# Patient Record
Sex: Male | Born: 1995 | Race: White | Hispanic: No | Marital: Single
Health system: Midwestern US, Community
[De-identification: ages and names within clinical notes are randomized; demographics above are authoritative.]

## PROBLEM LIST (undated history)

## (undated) DIAGNOSIS — F32A Depression, unspecified: Secondary | ICD-10-CM

## (undated) DIAGNOSIS — T7840XA Allergy, unspecified, initial encounter: Secondary | ICD-10-CM

## (undated) DIAGNOSIS — F329 Major depressive disorder, single episode, unspecified: Secondary | ICD-10-CM

## (undated) DIAGNOSIS — S42021A Displaced fracture of shaft of right clavicle, initial encounter for closed fracture: Secondary | ICD-10-CM

## (undated) DIAGNOSIS — S42021D Displaced fracture of shaft of right clavicle, subsequent encounter for fracture with routine healing: Secondary | ICD-10-CM

## (undated) HISTORY — PX: FRACTURE SURGERY: SHX138

## (undated) HISTORY — PX: JOINT REPLACEMENT: SHX530

## (undated) HISTORY — DX: Allergy, unspecified, initial encounter: T78.40XA

## (undated) HISTORY — DX: Depression, unspecified: F32.A

## (undated) HISTORY — DX: Major depressive disorder, single episode, unspecified: F32.9

---
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
## 2014-02-07 ENCOUNTER — Ambulatory Visit: Payer: BC Managed Care – PPO

## 2014-02-07 ENCOUNTER — Ambulatory Visit: Payer: BC Managed Care – PPO | Admitting: Family Medicine

## 2014-02-07 VITALS — BP 120/66 | HR 75 | Temp 97.4°F | Resp 16 | Ht 72.75 in | Wt 160.8 lb

## 2014-02-07 DIAGNOSIS — Z22322 Carrier or suspected carrier of Methicillin resistant Staphylococcus aureus: Secondary | ICD-10-CM

## 2014-02-07 DIAGNOSIS — S93401A Sprain of unspecified ligament of right ankle, initial encounter: Secondary | ICD-10-CM

## 2014-02-07 DIAGNOSIS — S93409A Sprain of unspecified ligament of unspecified ankle, initial encounter: Secondary | ICD-10-CM

## 2014-02-07 NOTE — Progress Notes (Signed)
Is a 18 year old athletic individual who twisted his right ankle 2 weeks ago and this had persistent swelling and tenderness anterior to the lateral malleolus since.  He's also here because he's had recent MRSA infection of the left knee he wants to make sure that his totally cleared.  Objective: Right ankle shows moderate amount of swelling and faint ecchymosis anterior to the lateral malleolus. He has full range of motion although it's stress inversion causes pain in that right ankle. There is no bony abnormality or bony tenderness.  Examination of the left knee reveals no tenderness, swelling, erythema or discharge from the area of the previous MRSA infection.  [facility] reading (PRIMARY) by  Dr. Milus GlazierLauenstein:  Right ankle-negative  Assessment: Ankle sprain with slow resolution, resolved MRSA infection  Plan: ASO/Lace up splint for the next 2 weeks.  Signed, Elvina SidleKurt Lealer [place]  No further treatment for the past MRSA contact

## 2014-04-04 ENCOUNTER — Emergency Department (HOSPITAL_BASED_OUTPATIENT_CLINIC_OR_DEPARTMENT_OTHER)
Admission: EM | Admit: 2014-04-04 | Discharge: 2014-04-04 | Disposition: A | Discharge status: Home / Self Care | Payer: BC Managed Care – PPO | Attending: Emergency Medicine | Admitting: Emergency Medicine

## 2014-04-04 ENCOUNTER — Encounter (HOSPITAL_BASED_OUTPATIENT_CLINIC_OR_DEPARTMENT_OTHER): Payer: Self-pay | Admitting: Emergency Medicine

## 2014-04-04 DIAGNOSIS — Y929 Unspecified place or not applicable: Secondary | ICD-10-CM | POA: Insufficient documentation

## 2014-04-04 DIAGNOSIS — F3289 Other specified depressive episodes: Secondary | ICD-10-CM | POA: Insufficient documentation

## 2014-04-04 DIAGNOSIS — F329 Major depressive disorder, single episode, unspecified: Secondary | ICD-10-CM | POA: Insufficient documentation

## 2014-04-04 DIAGNOSIS — Y9389 Activity, other specified: Secondary | ICD-10-CM | POA: Insufficient documentation

## 2014-04-04 DIAGNOSIS — S61209A Unspecified open wound of unspecified finger without damage to nail, initial encounter: Secondary | ICD-10-CM | POA: Insufficient documentation

## 2014-04-04 DIAGNOSIS — Z79899 Other long term (current) drug therapy: Secondary | ICD-10-CM | POA: Insufficient documentation

## 2014-04-04 DIAGNOSIS — W292XXA Contact with other powered household machinery, initial encounter: Secondary | ICD-10-CM | POA: Insufficient documentation

## 2014-04-04 DIAGNOSIS — Z792 Long term (current) use of antibiotics: Secondary | ICD-10-CM | POA: Insufficient documentation

## 2014-04-04 DIAGNOSIS — S61215A Laceration without foreign body of left ring finger without damage to nail, initial encounter: Secondary | ICD-10-CM

## 2014-04-04 MED ORDER — CEPHALEXIN 500 MG PO CAPS: 500.0000 mg | ORAL_CAPSULE | Freq: Three times a day (TID) | ORAL | Status: AC

## 2014-04-04 NOTE — ED Notes (Signed)
Laceration to the tip of his left 4th digit with a knife.

## 2014-04-04 NOTE — Discharge Instructions (Signed)
Have stitches removed in proximately one week. Keep wound clean and dry no swimming. Return for signs of infection.  If you were given medicines take as directed.  If you are on coumadin or contraceptives realize their levels and effectiveness is altered by many different medicines.  If you have any reaction (rash, tongues swelling, other) to the medicines stop taking and see a physician.   Please follow up as directed and return to the ER or see a physician for new or worsening symptoms.  Thank you. Filed Vitals:   04/04/14 1052  BP: 129/72  Pulse: 74  Temp: 98 F (36.7 C)  TempSrc: Oral  Height: 6\' 1"  (1.854 m)  Weight: 162 lb (73.483 kg)  SpO2: 100%

## 2014-04-04 NOTE — ED Provider Notes (Signed)
CSN: [id number]     Arrival date & time 04/04/14  1045 History   First MD Initiated Contact with Patient 04/04/14 1106     Chief Complaint  Patient presents with  . Laceration     (Consider location/radiation/quality/duration/timing/severity/associated sxs/prior Treatment) HPI Comments: 18 year old male with no significant medical history presents with left finger laceration since prior to arrival when playing with an exacto knife.  No other injuries, tetanus up-to-date, mild bleeding controlled with pressure.  Patient is a 18 y.o. male presenting with skin laceration. The history is provided by the patient.  Laceration   Past Medical History  Diagnosis Date  . Allergy   . Depression    Past Surgical History  Procedure Laterality Date  . Joint replacement    . Fracture surgery     Family History  Problem Relation Age of Onset  . Hypertension Father   . Cancer Paternal Grandmother   . Heart attack Paternal Grandfather    History  Substance Use Topics  . Smoking status: Never Smoker   . Smokeless tobacco: Not on file  . Alcohol Use: Yes     Comment: 8 DRINKS WEEKLY     Review of Systems  Skin: Positive for wound.  Neurological: Negative for weakness and numbness.      Allergies  Other and Peanuts  Home Medications   Prior to Admission medications   Medication Sig Start Date End Date Taking? Authorizing Provider  buPROPion (WELLBUTRIN XL) 300 MG 24 hr tablet Take 300 mg by mouth daily.    Historical Provider, MD  cephALEXin (KEFLEX) 500 MG capsule Take 1 capsule (500 mg total) by mouth 3 (three) times daily. 04/04/14   Enid Skeens, MD   BP 129/72  Pulse 74  Temp(Src) 98 F (36.7 C) (Oral)  Ht 6\' 1"  (1.854 m)  Wt 162 lb (73.483 kg)  BMI 21.38 kg/m2  SpO2 100% Physical Exam  Nursing note and vitals reviewed. Constitutional: He appears well-developed and well-nourished. No distress.  HENT:  Head: Atraumatic.  Cardiovascular: Normal rate.    Pulmonary/Chest: Effort normal.  Musculoskeletal: He exhibits tenderness.  Neurological: He is alert.  Skin:  Patient has mild tenderness and 1.5 cm curved laceration to the tip of his ring finger on the left side, no bone visualized, normal strength with flexion extension at PIP, mild bleeding mild gaping.    ED Course  Procedures (including critical care time)  LACERATION REPAIR Performed by: Enid Skeens Authorized by: Enid Skeens Consent: Verbal consent obtained. Risks and benefits: risks, benefits and alternatives were discussed Consent given by: patient Patient identity confirmed: provided demographic data Prepped and Draped in normal sterile fashion Wound explored  Laceration Location: left ring finger Laceration Length: 1.5cm No Foreign Bodies seen or palpated Anesthesia: local infiltration Local anesthetic: lidocaine 1% Anesthetic total: 3 ml Amount of cleaning: standard  Skin closure: approximated Number of sutures: 4  Technique: interupted ethilon  Patient tolerance: Patient tolerated the procedure well with no immediate complications.   Labs Review Labs Reviewed - No data to display  Imaging Review No results found.   EKG Interpretation None      MDM   Final diagnoses:  Laceration of left ring finger w/o foreign body w/o damage to nail, initial encounter   Laceration repaired in ER. Discussed return in followup, oral Keflex for 5 days. Wound cleaned Results and differential diagnosis were discussed with the patient/parent/guardian. Close follow up outpatient was discussed, comfortable with the plan.  Medications - No data to display  Filed Vitals:   04/04/14 1052  BP: 129/72  Pulse: 74  Temp: 98 F (36.7 C)  TempSrc: Oral  Height: 6\' 1"  (1.854 m)  Weight: 162 lb (73.483 kg)  SpO2: 100%        Enid SkeensJoshua M Abbigal Radich, MD 04/04/14 1159

## 2014-12-01 IMAGING — CR DG ANKLE COMPLETE 3+V*R*
2 series · 2 of 2 positions shown · non-contrast
Comparison: May 19, 2009

CLINICAL DATA: Pain post trauma

EXAM:
RIGHT ANKLE - COMPLETE 3+ VIEW

[AP]
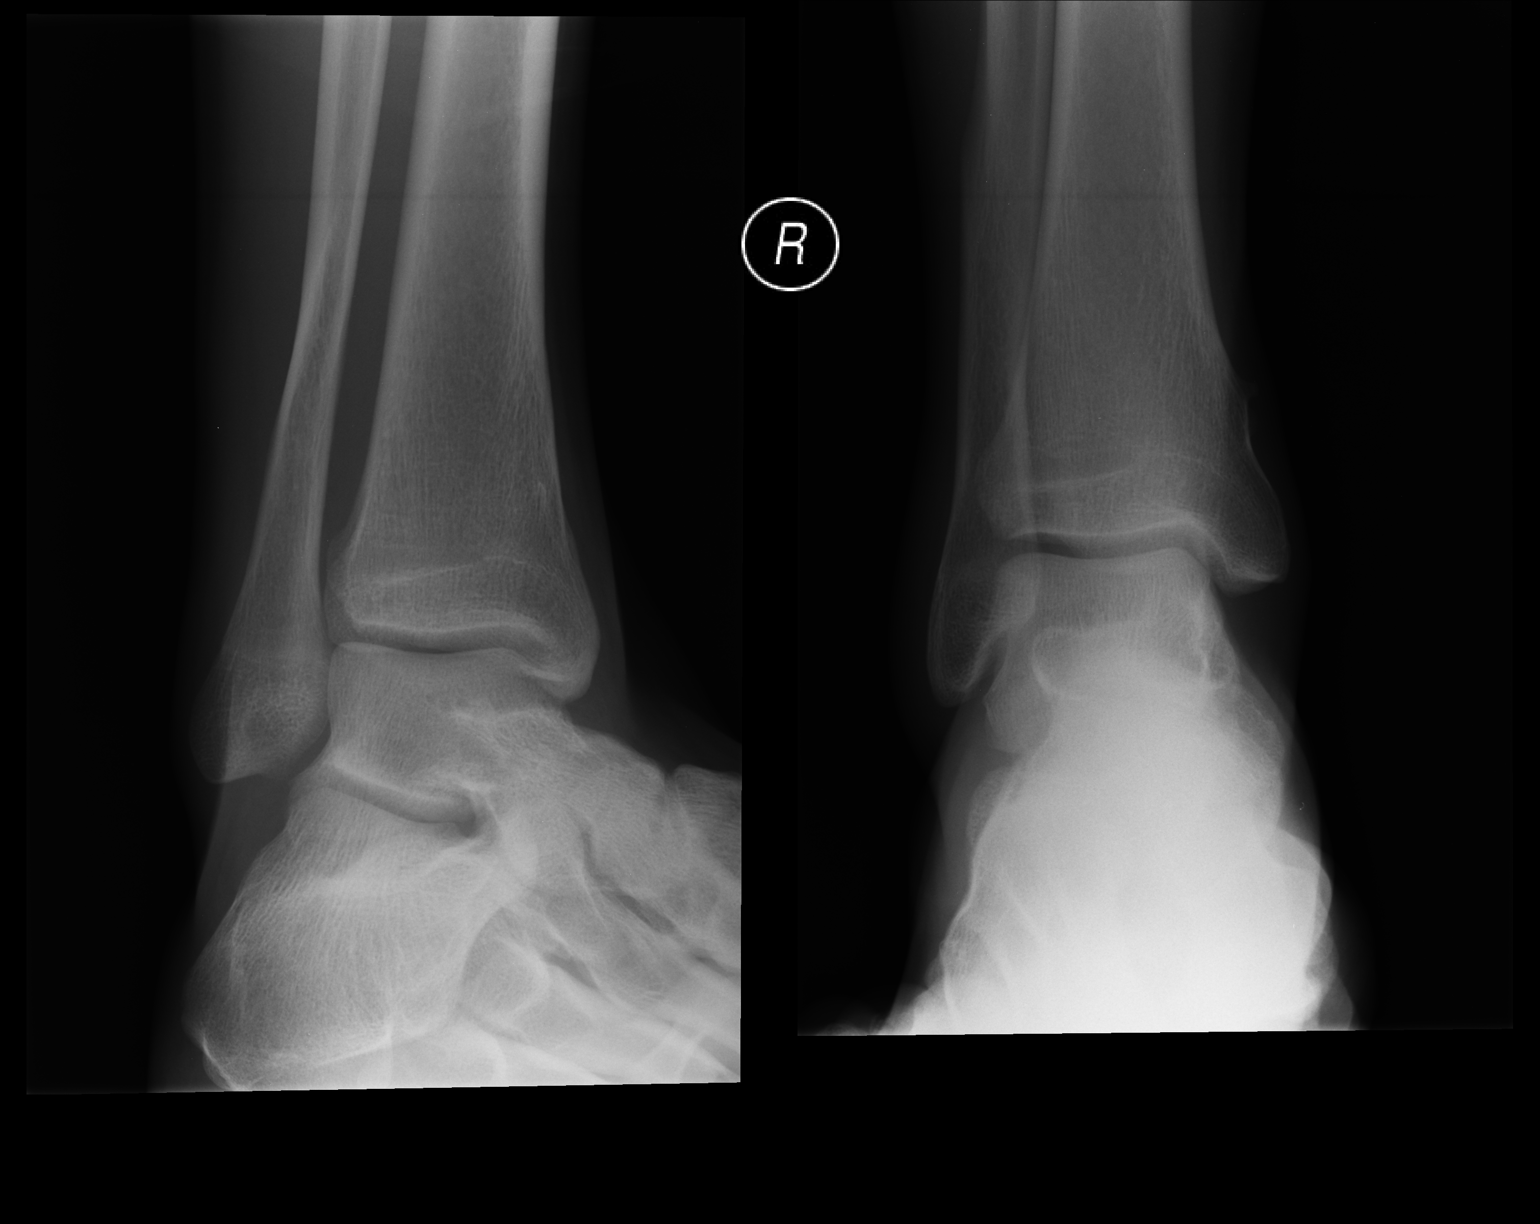

[ap obl int rot]
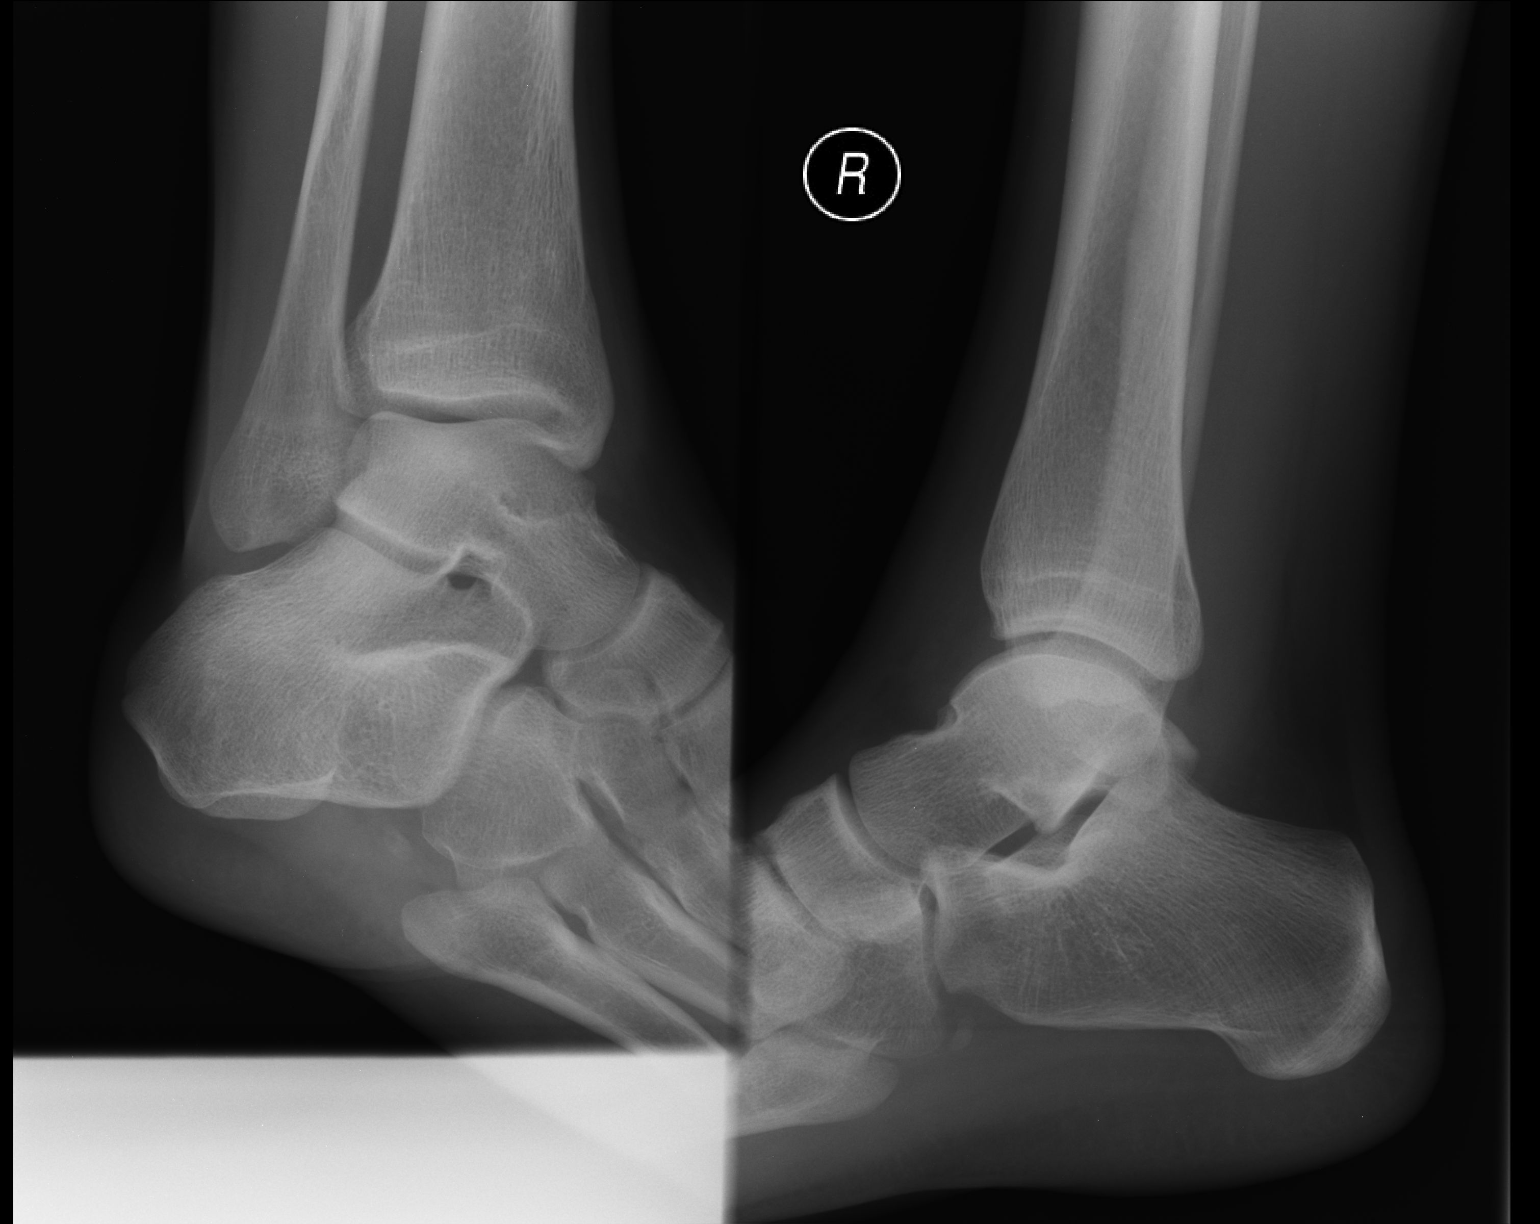

[2 of 2 positions shown; findings below may reference images not displayed]

FINDINGS: Frontal, oblique, and lateral views were obtained. There is no
fracture or effusion. Ankle mortise appears intact. No erosive
change.
IMPRESSION: No fracture.  Mortise intact.

## 2019-04-12 ENCOUNTER — Other Ambulatory Visit: Payer: Self-pay

## 2019-04-12 DIAGNOSIS — Z20822 Contact with and (suspected) exposure to covid-19: Secondary | ICD-10-CM

## 2019-04-15 LAB — NOVEL CORONAVIRUS, NAA: SARS-CoV-2, NAA: NOT DETECTED

## 2019-06-25 ENCOUNTER — Other Ambulatory Visit: Payer: Self-pay

## 2019-06-25 DIAGNOSIS — Z20822 Contact with and (suspected) exposure to covid-19: Secondary | ICD-10-CM

## 2019-06-27 LAB — NOVEL CORONAVIRUS, NAA: SARS-CoV-2, NAA: NOT DETECTED

## 2019-12-20 ENCOUNTER — Ambulatory Visit: Payer: BC Managed Care – PPO

## 2021-08-27 DIAGNOSIS — Z202 Contact with and (suspected) exposure to infections with a predominantly sexual mode of transmission: Secondary | ICD-10-CM

## 2021-08-27 NOTE — ED Provider Notes (Signed)
[facility] EMERGENCY DEPT  EMERGENCY DEPARTMENT ENCOUNTER      Pt Name: Adam Pham  MRN: [id number]  Birthdate Jan 13, 1996  Date of evaluation: 08/27/2021  Provider: Carlota Raspberry, MD    CHIEF COMPLAINT       Chief Complaint   Patient presents with    Exposure to STD     States he thinks he had oral sexual interaction with someone with an STD. Unknown what it was. Denies any symptoms at this time. States the interaction was about 1hr ago.          HISTORY OF PRESENT ILLNESS    HPI    Healthy 25 year old male presents with concerns about possible STD exposure.  The patient states he was giving oral sex to another male.  No reported symptoms from his partner but the patient frankly does not trust this sexual partner.  He is generally heterosexual.  Patient denies symptoms for himself.  No other complaints.    Nursing Notes were reviewed.    REVIEW OF SYSTEMS     Review of Systems    Except as noted above the remainder of the review of systems was reviewed and negative.       PAST MEDICAL HISTORY   History reviewed. No pertinent past medical history.    SURGICAL HISTORY     History reviewed. No pertinent surgical history.    CURRENT MEDICATIONS       Previous Medications    No medications on file       ALLERGIES     Patient has no known allergies.    FAMILY HISTORY     No family history on file.     SOCIAL HISTORY       Social History     Socioeconomic History    Marital status: Single     Spouse name: None    Number of children: None    Years of education: None    Highest education level: None       SCREENINGS       PHYSICAL EXAM       ED Triage Vitals [08/28/21 0030]   BP Temp Temp src Heart Rate Resp SpO2 Height Weight   114/70 98.4 ??F (36.9 ??C) -- 66 16 98 % 6' (1.829 m) 170 lb (77.1 kg)       Gen:  Alert, no acute distress or signs of toxicity  VS: Within normal limits and noted  HEENT: Pupils equal and reactive, o/p clear without erythema or exudates, mucous membranes moist  Neck: No nuchal rigidity or  adenopathy.  Cardiovascular: Regular rate and rhythm, no audible murmur  Lungs: No respiratory distress, O2 sat 100% on room air which is normal  Abdomen: Soft, nondistended  Musculoskeletal:  No tenderness or deformity. No range of motion deficit.   Neurologic: Alert, normal gait, no focal motor or sensory deficits  Skin: Warm and dry, normal turgor    DIAGNOSTIC RESULTS   Procedures       EKG: All EKG's are interpreted by the Emergency Department Physician who either signs or Co-signs this chart in the absence of a cardiologist.    RADIOLOGY    Non-plain film images such as CT, Ultrasound and MRI are read by the radiologist. Plain radiographic images are visualized and preliminarily interpreted by the emergency physician with the below findings:    Interpretation per the Radiologist below, if available at the time of this note:    No orders to display  LABS:  Labs Reviewed - No data to display    All other labs were within normal range or not returned as of this dictation.    EMERGENCY DEPARTMENT COURSE/REASSESSMENT and MDM:   MDM      ED Course as of 08/28/21 0050   Fri Aug 28, 2021   7096 Treated empirically for gonorrhea and chlamydia.  I had a brief discussion about viral allergies and how to monitor symptoms.  He was concerned about HIV and I explained that screening now would do no good but in 6 to 8 weeks would recommend a visit either to the health department or he should donate blood and then he would be able to get screened for HIV at that time.  Patient understands and is comfortable with this plan. [RE]      ED Course User Index  [RE] Carlota Raspberry, MD       PROCEDURES            CONSULTS:  None    FINAL IMPRESSION      1. Possible exposure to STD          DISPOSITION/PLAN   DISPOSITION Decision To Discharge 08/28/2021 12:47:50 AM      PATIENT REFERRED TO:  [facility]  [address]  [place] [address]  In 1 week  As needed    DISCHARGE  MEDICATIONS:  New Prescriptions    No medications on file     Controlled Substances Monitoring:     No flowsheet data found.    (Please note that portions of this note were completed with a voice recognition program.  Efforts were made to edit the dictations but occasionally words are mis-transcribed.)    Carlota Raspberry, MD (electronically signed)  Attending Emergency Physician            Carlota Raspberry, MD  08/28/21 [phone]

## 2021-08-28 ENCOUNTER — Inpatient Hospital Stay
Admit: 2021-08-28 | Discharge: 2021-08-28 | Disposition: A | Discharge status: Home / Self Care | Payer: PRIVATE HEALTH INSURANCE | Attending: Emergency Medicine

## 2021-08-28 DIAGNOSIS — Z202 Contact with and (suspected) exposure to infections with a predominantly sexual mode of transmission: Secondary | ICD-10-CM

## 2021-08-28 MED ORDER — AZITHROMYCIN 250 MG PO TABS
250 MG | Freq: Once | ORAL | Status: AC
Start: 2021-08-28 — End: 2021-08-28
  Administered 2021-08-28: 06:00:00 1000 mg via ORAL

## 2021-08-28 MED ORDER — CEFTRIAXONE SODIUM 1 G IJ SOLR
1 g | Freq: Once | INTRAMUSCULAR | Status: AC
Start: 2021-08-28 — End: 2021-08-28
  Administered 2021-08-28: 06:00:00 500 mg via INTRAMUSCULAR

## 2021-08-28 MED FILL — AZITHROMYCIN 250 MG PO TABS: 250 MG | ORAL | Qty: 4

## 2021-08-28 MED FILL — CEFTRIAXONE SODIUM 1 G IJ SOLR: 1 g | INTRAMUSCULAR | Qty: 1000

## 2021-10-12 ENCOUNTER — Ambulatory Visit: Admit: 2021-10-12 | Discharge: 2021-10-12 | Payer: PRIVATE HEALTH INSURANCE

## 2021-10-12 ENCOUNTER — Ambulatory Visit: Admit: 2021-10-12 | Discharge: 2021-10-12 | Payer: PRIVATE HEALTH INSURANCE | Attending: Surgical

## 2021-10-12 MED ORDER — HYDROCODONE-ACETAMINOPHEN 7.5-325 MG PO TABS
ORAL_TABLET | Freq: Four times a day (QID) | ORAL | 0 refills | Status: DC | PRN
Start: 2021-10-12 — End: 2021-10-12

## 2021-10-12 MED ORDER — CELECOXIB 200 MG PO CAPS
200 MG | ORAL_CAPSULE | Freq: Two times a day (BID) | ORAL | 0 refills | Status: DC
Start: 2021-10-12 — End: 2021-10-12

## 2021-10-12 MED ORDER — HYDROCODONE-ACETAMINOPHEN 7.5-325 MG PO TABS
ORAL_TABLET | Freq: Four times a day (QID) | ORAL | 0 refills | Status: DC | PRN
Start: 2021-10-12 — End: 2021-10-16

## 2021-10-12 MED ORDER — CYCLOBENZAPRINE HCL 10 MG PO TABS
10 MG | ORAL_TABLET | Freq: Three times a day (TID) | ORAL | 0 refills | Status: AC | PRN
Start: 2021-10-12 — End: 2021-10-22

## 2021-10-12 MED ORDER — CELECOXIB 200 MG PO CAPS
200 MG | ORAL_CAPSULE | Freq: Two times a day (BID) | ORAL | 0 refills | Status: AC
Start: 2021-10-12 — End: ?

## 2021-10-12 MED ORDER — CYCLOBENZAPRINE HCL 10 MG PO TABS
10 MG | ORAL_TABLET | Freq: Three times a day (TID) | ORAL | 0 refills | Status: DC | PRN
Start: 2021-10-12 — End: 2021-10-12

## 2021-10-12 NOTE — Progress Notes (Signed)
ORTHOPAEDIC SURGERY CLINIC NOTE    No chief complaint on file.      History of Present Illness:  Patient is a 26 year old male presents today with chief complaint of pain localized to the right clavicle.  Symptoms began acutely this past Saturday when he was snowboarding out in [place] and on his first run he sustained a fall and injured the aforementioned area.  He had immediate pain and limited motion of the right shoulder.  He was seen at an emergency center out there and x-rays were obtained and he was placed in a sling and advised follow-up here for further care.  He describes pain at the midshaft clavicle with some radiation into the shoulder.  He denies injury to other areas.  Denies shortness of breath or chest pain.  Denies loss of sensation or paresthesias.  Denies any specific injury to the glenohumeral joint.  Request evaluation and treatment.  He states he is very active and athletic.  Denies complaints referable to shortness of breath or chest pain or DVT or infection.    Review of Systems:  See HPI for pertinent Review of System Information.  No Known Allergies   Current Outpatient Medications   Medication Sig Dispense Refill    cyclobenzaprine (FLEXERIL) 10 MG tablet Take 1 tablet by mouth 3 times daily as needed for Muscle spasms 21 tablet 0    HYDROcodone-acetaminophen (NORCO) 7.5-325 MG per tablet Take 1 tablet by mouth every 6 hours as needed for Pain for up to 5 days. Take lowest dose possible to manage pain Max Daily Amount: 4 tablets 20 tablet 0    celecoxib (CELEBREX) 200 MG capsule Take 1 capsule by mouth 2 times daily 1 capsule by mouth twice a day with food 60 capsule 0     No current facility-administered medications for this visit.     No past medical history on file.   No past surgical history on file.  No family history on file.  Social History     Socioeconomic History    Marital status: Single     Spouse name: Not on file    Number of children: Not on file    Years of education:  Not on file    Highest education level: Not on file   Occupational History    Not on file   Tobacco Use    Smoking status: Not on file    Smokeless tobacco: Not on file   Substance and Sexual Activity    Alcohol use: Not on file    Drug use: Not on file    Sexual activity: Not on file   Other Topics Concern    Not on file   Social History Narrative    Not on file     Social Determinants of Health     Financial Resource Strain: Not on file   Food Insecurity: Not on file   Transportation Needs: Not on file   Physical Activity: Not on file   Stress: Not on file   Social Connections: Not on file   Intimate Partner Violence: Not on file   Housing Stability: Not on file      There were no vitals filed for this visit.  Physical Examination:  General:  well-nourished, well-developed, alert and oriented.   Psych/Neuro:  Speech clear, appropriate mood and affect.  HEENT:  normocephalic, atraumatic.  Chest:  normal inspiration and expiration. Symmetrical chest movement. No evidence of labored breathing.  Skin:  no obvious rashes  or lesions. See Musculoskeletal exam.  Musculoskeletal:    Examination of the right upper extremity reveals the skin to be intact. There is no warmth or erythema or rashes or lesions. Good sensation and capillary refill 5 digits. Good range of motion of all 5 digits. 2+ radial pulse. Good muscle tone, skin turgor and temperature. Forearm and upper arm compartments soft and nontender. Triceps, biceps, wrist extension and flexion strength are intact.  Moderate swelling at the area of the clavicle.  Tenderness with some deformity although no evidence of skin compromise at the area of the midshaft clavicle.  Acromioclavicular joint and glenohumeral joint appear to be in good alignment.  Pain localized to the clavicle with attempts at range of motion of the shoulder.  Good range of motion of the wrist and elbow without pain.  Full range of motion of the cervical spine without pain.    Imaging:    Patient  did bring a disc with him from [place] however the images were not able to be pulled up on this disc so new x-rays were ordered and interpreted today and I independently reviewed those as well as radiologist report.  They do show a displaced midshaft clavicle fracture with approximately 2 cm of overlap.  Glenohumeral joint and acromioclavicular joint in good alignment.  No fracture identified otherwise.           Assessment & Plan:   Diagnosis Orders   1. Closed displaced fracture of shaft of right clavicle, initial encounter  XR CLAVICLE RIGHT    cyclobenzaprine (FLEXERIL) 10 MG tablet    HYDROcodone-acetaminophen (NORCO) 7.5-325 MG per tablet    celecoxib (CELEBREX) 200 MG capsule    DISCONTINUED: HYDROcodone-acetaminophen (NORCO) 7.5-325 MG per tablet    DISCONTINUED: celecoxib (CELEBREX) 200 MG capsule    DISCONTINUED: cyclobenzaprine (FLEXERIL) 10 MG tablet    DISCONTINUED: HYDROcodone-acetaminophen (NORCO) 7.5-325 MG per tablet        I reviewed the patient's x-rays with him in detail.  He does have a displaced midshaft clavicle fracture.  He is very interested in consideration of surgical intervention for this if indicated.  I recommended he continue the use of his sling and also made recommendations for appropriate rest and ice to the area of the clavicle at appropriate intervals and appropriate precautions.  He is having significant discomfort as expected at this point with this injury.  An appropriate medication was prescribed with appropriate precautions.  Medication  appropriate dosing, precautions and potential side effects were discussed with the patient at length today.  Patient expressed understanding of discontinuing medications immediately if side effects occur and contacting the office immediately.  Seek immediate medical attention if anaphylactic reaction occurs.  Additionally patient understood not to take medications in the same class of medications while taking the medications prescribed  today.  I made him an appointment to see Dr. Janice Norrie in the office for consultation at 9 AM on 10/14/2021.  I did explain to the patient in detail that fracture such as this may be recommended to treat operatively or nonoperatively.  He expressed his understanding of this.  Continue to use of the sling.  I made recommendations for appropriate activity and exercise and discussed any appropriate restrictions and modifications.  Follow-up sooner should any questions, concerns,  worsening symptoms or new symptoms arise. Patient  and caregivers expressed understanding and agreement with the plan. All questions answered.  A total of 45   minutes was spent today regarding this encounter.  This included  preparation to see the patient.  Review of the patient's history including prior records and studies as well as meeting with the patient and examining them, discussion of their care and orders and documentation.       Orders Placed This Encounter    XR CLAVICLE RIGHT     B4     Standing Status:   Future     Number of Occurrences:   1     Standing Expiration Date:   10/12/2022    DISCONTD: cyclobenzaprine (FLEXERIL) 10 MG tablet     Sig: Take 1 tablet by mouth 3 times daily as needed for Muscle spasms     Dispense:  21 tablet     Refill:  0    DISCONTD: HYDROcodone-acetaminophen (NORCO) 7.5-325 MG per tablet     Sig: Take 1 tablet by mouth every 6 hours as needed for Pain for up to 5 days. Take lowest dose possible to manage pain Max Daily Amount: 4 tablets     Dispense:  20 tablet     Refill:  0     Reduce doses taken as pain becomes manageable    DISCONTD: celecoxib (CELEBREX) 200 MG capsule     Sig: Take 1 capsule by mouth 2 times daily 1 capsule by mouth twice a day with food     Dispense:  60 capsule     Refill:  0    DISCONTD: celecoxib (CELEBREX) 200 MG capsule     Sig: Take 1 capsule by mouth 2 times daily 1 capsule by mouth twice a day with food     Dispense:  60 capsule     Refill:  0    DISCONTD: cyclobenzaprine  (FLEXERIL) 10 MG tablet     Sig: Take 1 tablet by mouth 3 times daily as needed for Muscle spasms     Dispense:  21 tablet     Refill:  0    DISCONTD: HYDROcodone-acetaminophen (NORCO) 7.5-325 MG per tablet     Sig: Take 1 tablet by mouth every 6 hours as needed for Pain for up to 5 days. Take lowest dose possible to manage pain Max Daily Amount: 4 tablets     Dispense:  20 tablet     Refill:  0     Reduce doses taken as pain becomes manageable    cyclobenzaprine (FLEXERIL) 10 MG tablet     Sig: Take 1 tablet by mouth 3 times daily as needed for Muscle spasms     Dispense:  21 tablet     Refill:  0    HYDROcodone-acetaminophen (NORCO) 7.5-325 MG per tablet     Sig: Take 1 tablet by mouth every 6 hours as needed for Pain for up to 5 days. Take lowest dose possible to manage pain Max Daily Amount: 4 tablets     Dispense:  20 tablet     Refill:  0     Reduce doses taken as pain becomes manageable    celecoxib (CELEBREX) 200 MG capsule     Sig: Take 1 capsule by mouth 2 times daily 1 capsule by mouth twice a day with food     Dispense:  60 capsule     Refill:  0          Return for Refer for consultation: Dr. Janice NorrieLowery on 10/14/2021.       Luvenia HellerJohn J Young Mulvey, PA  Physician Assistant  Orthopaedic Surgery    Electronically signed by Luvenia HellerJohn J Janyth Riera, PA  on 10/12/2021 at 7:00 PM

## 2021-10-14 ENCOUNTER — Ambulatory Visit: Admit: 2021-10-14 | Discharge: 2021-10-14 | Payer: PRIVATE HEALTH INSURANCE | Attending: Orthopaedic Surgery

## 2021-10-14 DIAGNOSIS — S42021A Displaced fracture of shaft of right clavicle, initial encounter for closed fracture: Secondary | ICD-10-CM

## 2021-10-14 NOTE — Progress Notes (Signed)
[facility]  [facility]  [address]  [address]  [place] [address]  Dept: [phone]     10/14/2021    Adam Pham (DOB: 05/16/1996) is a 25 y.o. male, here for evaluation of the following chief complaint(s): Follow-up (Pt was referred by John for Rt clavicle fx from fall 10/11/21 while snowboarding, landing on right shoulder.  )      SUBJECTIVE/OBJECTIVE:    History of Present Illness: Patient is a 25-year-old gentleman referred by John Vollmer in regards to an injury to his right shoulder.  He fell last weekend while snowboarding in [place].  He had pain and deformity.  He went to the local center where physical examination and x-rays were consistent with a midshaft right clavicle fracture.  He is right-handed.  He notes significant pain with use of this.  He denies other injuries.  He has had a previous left clavicle fracture in the past treated nonoperatively.  He notes that this is much worse.    Review of Systems:Non contributory other than as HPI    Current Outpatient Medications   Medication Sig Dispense Refill    cyclobenzaprine (FLEXERIL) 10 MG tablet Take 1 tablet by mouth 3 times daily as needed for Muscle spasms 21 tablet 0    HYDROcodone-acetaminophen (NORCO) 7.5-325 MG per tablet Take 1 tablet by mouth every 6 hours as needed for Pain for up to 5 days. Take lowest dose possible to manage pain Max Daily Amount: 4 tablets 20 tablet 0    celecoxib (CELEBREX) 200 MG capsule Take 1 capsule by mouth 2 times daily 1 capsule by mouth twice a day with food 60 capsule 0     No current facility-administered medications for this visit.        No Known Allergies     Past Medical History:   Diagnosis Date    History of repair of ACL         Past Surgical History:   Procedure Laterality Date    ANTERIOR CRUCIATE LIGAMENT REPAIR Left 2014    no complications        No family history on file.     Social History     Socioeconomic History     Marital status: Single     Spouse name: Not on file    Number of children: Not on file    Years of education: Not on file    Highest education level: Not on file   Occupational History    Not on file   Tobacco Use    Smoking status: Never    Smokeless tobacco: Never   Vaping Use    Vaping Use: Never used   Substance and Sexual Activity    Alcohol use: Yes     Alcohol/week: 2.0 standard drinks     Types: 2 Drinks containing 0.5 oz of alcohol per week    Drug use: Never    Sexual activity: Not on file   Other Topics Concern    Not on file   Social History Narrative    Not on file     Social Determinants of Health     Financial Resource Strain: Not on file   Food Insecurity: Not on file   Transportation Needs: Not on file   Physical Activity: Not on file   Stress: Not on file   Social Connections: Not on file   Intimate Partner Violence: Not on file     Housing Stability: Not on file        There were no vitals filed for this visit.         Ancillary Data Reviewed:      Physical Examination:  HEENT: Normocephalic atraumatic     Neck: Normal flexibility for age, no JVD     Ocular: Sclera clear     Chest: No audible wheezes     Cardiovascular: Regular rate, no peripheral edema    Lumbar spine: Normal flexibility for age    Upper extremity: Pain and deformity of the right clavicle is noted in the midshaft region.  Pain with motion of the arm is noted.  No pain with motion of the left shoulder either elbow or wrist.     Lower extremity: Good range of motion for hips, knees, and ankles     Neurologic: No focal defects noted     Dermatologic: No lesions on exposed areas      Imaging  No image results found.               ASSESSMENT/PLAN:    1. Closed displaced fracture of shaft of right clavicle, initial encounter  -     [facility] - Nelida Gores MD, [facility]  Have reviewed his examination as well as his x-rays.  He does have a displaced midshaft clavicle fracture with 1.6 cm of displacement.  We reviewed  this with him.  We have discussed risk benefits and potential complications of both nonsurgical and surgical management.  He would like to proceed with surgical management.  We reviewed this with him.  He understands the complications and wishes to proceed.  These include nonunion infection as well as prominent hardware requiring removal in the future.    Orders Placed This Encounter    [facility] - Nelida Gores MD, [facility]     Referral Priority:   Routine     Referral Type:   Surgical     Referral Reason:   Insurance     Referred to Provider:   Nelida Gores, MD     Requested Specialty:   Orthopedic Surgery     Number of Visits Requested:   1        Return Postop.        An electronic signature was used to authenticate this note.    Nelida Gores, MD

## 2021-10-14 NOTE — Progress Notes (Signed)
Pre Procedure Patient Instructions    Procedure Location hospital:[facility]: [address] [place] - take a right at [address] and park in the Ambulatory Center on your right. Drop off at the door that says - Entrance to 2nd and 3rd floor - take elevator to 2nd floor and follow arrows to [address] for registration.    Procedure Date 10/16/2021  Arrival Time Per Physicians Instructions     Medications:  Medication to be taken the morning of surgery with a few sips of water only: hydrocodone if needed,flexeril if needed  Hold or reduce the dosage of the following medication, as discussed: no celebrex now until after procedure    Stop all supplements, vitamins and herbal remedies one week prior to your procedure, unless your doctor told you to continue taking.  Do not take over the counter pain medications except plain Tylenol or acetaminophen unless your doctor told you to do so.      Procedure Preparation    Diet Restrictions:No food or drink including gum or mints -after midnight    Skin Preparation:   Wash with Hibiclens or an antibacterial soap (e.g. Dial soap) the night before and morning of procedure.  Using a clean washcloth, wash from neck to toes for 3 minutes.Gently clean the area where your surgery will be done thoroughly  Do not use Hibiclens on or near your face, eyes, ears, or head  Do not put on any deodorants, lotions, powders, or oils afterwards. Be sure to put on clean clothing.    Other Preparation:  Call your surgeon right away if you get any wounds, cuts, scrapes, scabs, rashes, bug bites at or near your operative site.  Bowel prep as instructed by your doctor  Fleets enema as instructed by your doctor  No test required before surgery      Day of Procedure Patient Instruction:  Do not smoke, vape, chew tobacco, drink alcohol or use recreational drugs on the day of your procedure  Remove all jewelry, piercings and metal accessories  Do not wear contacts, tampons, make-up,  lotions, creams, powders, fragrances or deodorant  Do not bring valuables or money  Bring a picture ID and insurance card and any of the following that are applicable to you:    ?? Inhalers    ?? CPAP or BiPAP machine    ?? Remote for spinal cord stimulator or other implanted device    ?? Insulin pump supplies    ?? Walker or other orthopedic device necessary for postop    ?? Storage case for eyeglasses, hearing aids, dentures, etc    ?? A loose button-up shirt if instructed    ?? Copy of your Living Will and/or Medical Durable Power of Attorney    ?? List of current medications including name and dosage    .  If you are going home the same day as your procedure, a support person should accompany you to the facility and must transport you home.  If you plan to take public transportation of any sort, your support person must accompany you home.  You will need someone to stay with you for 24 hours after your procedure with sedation of any kind.         COVID Testing Instructions:  No COVID testing required as discussed.       Comments: none    The information and visitor policy was reviewed with you during your Pre-Admission Testing interview and you verbalized understanding. If  you have any additional questions please contact [phone]    For financial questions regarding your procedure at a [facility] facility, please contact [phone], option 1    For financial questions regarding anesthesia at a [facility] facility, please  contact [phone]

## 2021-10-14 NOTE — H&P (View-Only) (Signed)
[facility]  [facility]  [address]  [address]  [place] [address]  Dept: [phone]     10/14/2021    Adam Pham (DOB: 1996-04-05) is a 26 y.o. male, here for evaluation of the following chief complaint(s): Follow-up (Pt was referred by Jonny Ruiz for Rt clavicle fx from fall 10/11/21 while snowboarding, landing on right shoulder.  )      SUBJECTIVE/OBJECTIVE:    History of Present Illness: Patient is a 26 year old gentleman referred by Williams Che in regards to an injury to his right shoulder.  He fell last weekend while snowboarding in [place].  He had pain and deformity.  He went to the local center where physical examination and x-rays were consistent with a midshaft right clavicle fracture.  He is right-handed.  He notes significant pain with use of this.  He denies other injuries.  He has had a previous left clavicle fracture in the past treated nonoperatively.  He notes that this is much worse.    Review of Systems:Non contributory other than as HPI    Current Outpatient Medications   Medication Sig Dispense Refill    cyclobenzaprine (FLEXERIL) 10 MG tablet Take 1 tablet by mouth 3 times daily as needed for Muscle spasms 21 tablet 0    HYDROcodone-acetaminophen (NORCO) 7.5-325 MG per tablet Take 1 tablet by mouth every 6 hours as needed for Pain for up to 5 days. Take lowest dose possible to manage pain Max Daily Amount: 4 tablets 20 tablet 0    celecoxib (CELEBREX) 200 MG capsule Take 1 capsule by mouth 2 times daily 1 capsule by mouth twice a day with food 60 capsule 0     No current facility-administered medications for this visit.        No Known Allergies     Past Medical History:   Diagnosis Date    History of repair of ACL         Past Surgical History:   Procedure Laterality Date    ANTERIOR CRUCIATE LIGAMENT REPAIR Left 2014    no complications        No family history on file.     Social History     Socioeconomic History     Marital status: Single     Spouse name: Not on file    Number of children: Not on file    Years of education: Not on file    Highest education level: Not on file   Occupational History    Not on file   Tobacco Use    Smoking status: Never    Smokeless tobacco: Never   Vaping Use    Vaping Use: Never used   Substance and Sexual Activity    Alcohol use: Yes     Alcohol/week: 2.0 standard drinks     Types: 2 Drinks containing 0.5 oz of alcohol per week    Drug use: Never    Sexual activity: Not on file   Other Topics Concern    Not on file   Social History Narrative    Not on file     Social Determinants of Health     Financial Resource Strain: Not on file   Food Insecurity: Not on file   Transportation Needs: Not on file   Physical Activity: Not on file   Stress: Not on file   Social Connections: Not on file   Intimate Partner Violence: Not on file  Housing Stability: Not on file        There were no vitals filed for this visit.         Ancillary Data Reviewed:      Physical Examination:  HEENT: Normocephalic atraumatic     Neck: Normal flexibility for age, no JVD     Ocular: Sclera clear     Chest: No audible wheezes     Cardiovascular: Regular rate, no peripheral edema    Lumbar spine: Normal flexibility for age    Upper extremity: Pain and deformity of the right clavicle is noted in the midshaft region.  Pain with motion of the arm is noted.  No pain with motion of the left shoulder either elbow or wrist.     Lower extremity: Good range of motion for hips, knees, and ankles     Neurologic: No focal defects noted     Dermatologic: No lesions on exposed areas      Imaging  No image results found.               ASSESSMENT/PLAN:    1. Closed displaced fracture of shaft of right clavicle, initial encounter  -     [facility] - Nelida Gores MD, [facility]  Have reviewed his examination as well as his x-rays.  He does have a displaced midshaft clavicle fracture with 1.6 cm of displacement.  We reviewed  this with him.  We have discussed risk benefits and potential complications of both nonsurgical and surgical management.  He would like to proceed with surgical management.  We reviewed this with him.  He understands the complications and wishes to proceed.  These include nonunion infection as well as prominent hardware requiring removal in the future.    Orders Placed This Encounter    [facility] - Nelida Gores MD, [facility]     Referral Priority:   Routine     Referral Type:   Surgical     Referral Reason:   Insurance     Referred to Provider:   Nelida Gores, MD     Requested Specialty:   Orthopedic Surgery     Number of Visits Requested:   1        Return Postop.        An electronic signature was used to authenticate this note.    Nelida Gores, MD

## 2021-10-16 ENCOUNTER — Inpatient Hospital Stay: Payer: PRIVATE HEALTH INSURANCE

## 2021-10-16 MED ORDER — ROPIVACAINE HCL 5 MG/ML IJ SOLN
INTRAMUSCULAR | Status: AC
Start: 2021-10-16 — End: 2021-10-16

## 2021-10-16 MED ORDER — CEFAZOLIN SODIUM-DEXTROSE 2-3 GM-%(50ML) IV SOLR
2000 mg in Dextrose 3% | INTRAVENOUS | Status: DC
Start: 2021-10-16 — End: 2021-10-16

## 2021-10-16 MED ORDER — HYDROMORPHONE HCL 2 MG/ML IJ SOLN
2 MG/ML | INTRAMUSCULAR | Status: AC
Start: 2021-10-16 — End: 2021-10-16
  Administered 2021-10-16: 15:00:00 0.5 via INTRAVENOUS

## 2021-10-16 MED ORDER — OXYCODONE HCL 5 MG PO TABS
5 MG | ORAL_TABLET | ORAL | 0 refills | Status: AC | PRN
Start: 2021-10-16 — End: 2021-10-21

## 2021-10-16 MED ORDER — HALOPERIDOL LACTATE 5 MG/ML IJ SOLN
5 MG/ML | Freq: Once | INTRAMUSCULAR | Status: DC | PRN
Start: 2021-10-16 — End: 2021-10-16

## 2021-10-16 MED ORDER — DEXTROSE 10 % IV BOLUS
INTRAVENOUS | Status: DC | PRN
Start: 2021-10-16 — End: 2021-10-16

## 2021-10-16 MED ORDER — KETOROLAC TROMETHAMINE 30 MG/ML IJ SOLN
30 MG/ML | INTRAMUSCULAR | Status: DC | PRN
Start: 2021-10-16 — End: 2021-10-16
  Administered 2021-10-16: 14:00:00 30 via INTRAVENOUS

## 2021-10-16 MED ORDER — ACETAMINOPHEN 500 MG PO TABS
500 MG | Freq: Once | ORAL | Status: AC
Start: 2021-10-16 — End: 2021-10-16
  Administered 2021-10-16: 12:00:00 1000 mg via ORAL

## 2021-10-16 MED ORDER — CELECOXIB 100 MG PO CAPS
100 MG | Freq: Once | ORAL | Status: AC
Start: 2021-10-16 — End: 2021-10-16
  Administered 2021-10-16: 12:00:00 200 mg via ORAL

## 2021-10-16 MED ORDER — DEXMEDETOMIDINE HCL 200 MCG/2ML IV SOLN
200 MCG/2ML | INTRAVENOUS | Status: AC
Start: 2021-10-16 — End: 2021-10-16

## 2021-10-16 MED ORDER — ONDANSETRON 4 MG PO TBDP
4 MG | ORAL_TABLET | Freq: Four times a day (QID) | ORAL | 0 refills | Status: AC | PRN
Start: 2021-10-16 — End: ?

## 2021-10-16 MED ORDER — TRAMADOL HCL 50 MG PO TABS
50 MG | ORAL_TABLET | Freq: Four times a day (QID) | ORAL | 0 refills | Status: AC | PRN
Start: 2021-10-16 — End: 2021-10-21

## 2021-10-16 MED ORDER — ROPIVACAINE HCL 5 MG/ML IJ SOLN
INTRAMUSCULAR | Status: AC
Start: 2021-10-16 — End: 2021-10-16
  Administered 2021-10-16: 13:00:00 20 via PERINEURAL
  Administered 2021-10-16: 13:00:00 5 via PERINEURAL

## 2021-10-16 MED ORDER — OXYCODONE HCL 5 MG PO TABS
5 MG | ORAL | Status: DC | PRN
Start: 2021-10-16 — End: 2021-10-16
  Administered 2021-10-16: 15:00:00 5 mg via ORAL

## 2021-10-16 MED ORDER — LACTATED RINGERS IV SOLN
INTRAVENOUS | Status: DC | PRN
Start: 2021-10-16 — End: 2021-10-16
  Administered 2021-10-16: 13:00:00 via INTRAVENOUS

## 2021-10-16 MED ORDER — OXYCODONE HCL 5 MG PO TABS
5 MG | ORAL | Status: DC | PRN
Start: 2021-10-16 — End: 2021-10-16

## 2021-10-16 MED ORDER — DEXMEDETOMIDINE HCL 200 MCG/2ML IV SOLN
200 MCG/2ML | INTRAVENOUS | Status: AC
Start: 2021-10-16 — End: 2021-10-16
  Administered 2021-10-16: 13:00:00 .5 via PERINEURAL

## 2021-10-16 MED ORDER — HYDROMORPHONE HCL 2 MG/ML IJ SOLN
2 MG/ML | INTRAMUSCULAR | Status: DC | PRN
Start: 2021-10-16 — End: 2021-10-16

## 2021-10-16 MED ORDER — ACETAMINOPHEN 500 MG PO TABS
500 MG | ORAL_TABLET | ORAL | 0 refills | Status: AC
Start: 2021-10-16 — End: ?

## 2021-10-16 MED ORDER — SODIUM CHLORIDE 0.9 % IV SOLN
0.9 % | INTRAVENOUS | Status: DC
Start: 2021-10-16 — End: 2021-10-16

## 2021-10-16 MED ORDER — GLUCOSE 4 G PO CHEW
4 g | ORAL | Status: DC | PRN
Start: 2021-10-16 — End: 2021-10-16

## 2021-10-16 MED ORDER — GABAPENTIN 300 MG PO CAPS
300 MG | ORAL_CAPSULE | Freq: Every evening | ORAL | 0 refills | Status: AC
Start: 2021-10-16 — End: 2021-11-15

## 2021-10-16 MED ORDER — PROPOFOL 200 MG/20ML IV EMUL
200 MG/20ML | INTRAVENOUS | Status: DC | PRN
Start: 2021-10-16 — End: 2021-10-16
  Administered 2021-10-16: 13:00:00 100 via INTRAVENOUS

## 2021-10-16 MED ORDER — NORMAL SALINE FLUSH 0.9 % IV SOLN
0.9 % | Freq: Two times a day (BID) | INTRAVENOUS | Status: DC
Start: 2021-10-16 — End: 2021-10-16

## 2021-10-16 MED ORDER — FENTANYL CITRATE (PF) 100 MCG/2ML IJ SOLN
100 MCG/2ML | INTRAMUSCULAR | Status: AC
Start: 2021-10-16 — End: ?

## 2021-10-16 MED ORDER — MIDAZOLAM HCL 2 MG/2ML IJ SOLN
2 MG/ML | INTRAMUSCULAR | Status: AC
Start: 2021-10-16 — End: ?

## 2021-10-16 MED ORDER — CEFAZOLIN SODIUM-DEXTROSE 2-3 GM-%(50ML) IV SOLR
20003 mg in Dextrose 3% | INTRAVENOUS | Status: DC | PRN
Start: 2021-10-16 — End: 2021-10-16
  Administered 2021-10-16: 13:00:00 2000 via INTRAVENOUS

## 2021-10-16 MED ORDER — MIDAZOLAM HCL 2 MG/2ML IJ SOLN
22 MG/ML | INTRAMUSCULAR | Status: DC | PRN
Start: 2021-10-16 — End: 2021-10-16
  Administered 2021-10-16: 13:00:00 2 via INTRAVENOUS

## 2021-10-16 MED ORDER — FENTANYL CITRATE (PF) 100 MCG/2ML IJ SOLN
1002 MCG/2ML | INTRAMUSCULAR | Status: DC | PRN
Start: 2021-10-16 — End: 2021-10-16
  Administered 2021-10-16: 13:00:00 100 via INTRAVENOUS

## 2021-10-16 MED ORDER — ONDANSETRON HCL 4 MG/2ML IJ SOLN
4 MG/2ML | INTRAMUSCULAR | Status: DC | PRN
Start: 2021-10-16 — End: 2021-10-16
  Administered 2021-10-16: 14:00:00 4 via INTRAVENOUS

## 2021-10-16 MED ORDER — NORMAL SALINE FLUSH 0.9 % IV SOLN
0.9 % | INTRAVENOUS | Status: DC | PRN
Start: 2021-10-16 — End: 2021-10-16

## 2021-10-16 MED ORDER — DEXTROSE 10 % IV SOLN
10 % | INTRAVENOUS | Status: DC | PRN
Start: 2021-10-16 — End: 2021-10-16

## 2021-10-16 MED ORDER — DEXAMETHASONE SODIUM PHOSPHATE 4 MG/ML IJ SOLN
4 MG/ML | INTRAMUSCULAR | Status: DC | PRN
Start: 2021-10-16 — End: 2021-10-16
  Administered 2021-10-16: 13:00:00 4 via INTRAVENOUS

## 2021-10-16 MED ORDER — LACTATED RINGERS IV SOLN
INTRAVENOUS | Status: DC
Start: 2021-10-16 — End: 2021-10-16
  Administered 2021-10-16: 12:00:00 via INTRAVENOUS

## 2021-10-16 MED ORDER — DEXAMETHASONE SODIUM PHOSPHATE 4MG/1ML 5 ML IJ SOLN
4 MG/ML | INTRAMUSCULAR | Status: AC
Start: 2021-10-16 — End: 2021-10-16
  Administered 2021-10-16: 13:00:00 10 via PERINEURAL

## 2021-10-16 MED ORDER — FENTANYL CITRATE (PF) 100 MCG/2ML IJ SOLN
100 MCG/2ML | INTRAMUSCULAR | Status: DC | PRN
Start: 2021-10-16 — End: 2021-10-16
  Administered 2021-10-16: 13:00:00 100 via INTRAVENOUS

## 2021-10-16 MED ORDER — MIDAZOLAM HCL 2 MG/2ML IJ SOLN
2 MG/ML | INTRAMUSCULAR | Status: DC | PRN
Start: 2021-10-16 — End: 2021-10-16
  Administered 2021-10-16: 13:00:00 2 via INTRAVENOUS

## 2021-10-16 MED ORDER — GLUCAGON HCL RDNA (DIAGNOSTIC) 1 MG IJ SOLR
1 MG | INTRAMUSCULAR | Status: DC | PRN
Start: 2021-10-16 — End: 2021-10-16

## 2021-10-16 MED ORDER — DEXAMETHASONE SOD PHOSPHATE PF 10 MG/ML IJ SOLN
10 MG/ML | INTRAMUSCULAR | Status: DC
Start: 2021-10-16 — End: 2021-10-16

## 2021-10-16 MED ORDER — ONDANSETRON HCL 4 MG/2ML IJ SOLN
4 MG/2ML | Freq: Once | INTRAMUSCULAR | Status: DC | PRN
Start: 2021-10-16 — End: 2021-10-16

## 2021-10-16 MED ORDER — ASPIRIN EC 81 MG PO TBEC
81 MG | ORAL_TABLET | ORAL | 0 refills | Status: AC
Start: 2021-10-16 — End: ?

## 2021-10-16 MED FILL — CELECOXIB 100 MG PO CAPS: 100 MG | ORAL | Qty: 2

## 2021-10-16 MED FILL — FENTANYL CITRATE (PF) 100 MCG/2ML IJ SOLN: 100 MCG/2ML | INTRAMUSCULAR | Qty: 2

## 2021-10-16 MED FILL — HYDROMORPHONE HCL 2 MG/ML IJ SOLN: 2 MG/ML | INTRAMUSCULAR | Qty: 1

## 2021-10-16 MED FILL — MIDAZOLAM HCL 2 MG/2ML IJ SOLN: 2 MG/ML | INTRAMUSCULAR | Qty: 2

## 2021-10-16 MED FILL — NAROPIN 5 MG/ML IJ SOLN: 5 MG/ML | INTRAMUSCULAR | Qty: 60

## 2021-10-16 MED FILL — SM PAIN RELIEF 500 MG PO TABS: 500 MG | ORAL | Qty: 2

## 2021-10-16 MED FILL — OXYCODONE HCL 5 MG PO TABS: 5 MG | ORAL | Qty: 1

## 2021-10-16 MED FILL — DEXMEDETOMIDINE HCL 200 MCG/2ML IV SOLN: 200 MCG/2ML | INTRAVENOUS | Qty: 2

## 2021-10-16 MED FILL — DEXAMETHASONE SOD PHOSPHATE PF 10 MG/ML IJ SOLN: 10 MG/ML | INTRAMUSCULAR | Qty: 1

## 2021-10-16 MED FILL — CEFAZOLIN SODIUM-DEXTROSE 2-3 GM-%(50ML) IV SOLR: 2000 mg in Dextrose 3% | INTRAVENOUS | Qty: 50

## 2021-10-16 NOTE — Telephone Encounter (Signed)
Spoke to the patient's mom and she just wanted to be sure how long her son needed to take his medication like aspirin and Celebrex after surgery. I explained Dr. Salvadore Farber post op medication regimne and she confirmed her son's 1ST post op  appointment and she has my direct number if needed.

## 2021-10-16 NOTE — Telephone Encounter (Signed)
MOM (JENNIFER) IS REQUESTING A CALL BACK AT [phone].

## 2021-10-16 NOTE — Op Note (Signed)
Operative Note      Patient: Adam Pham  Date of Birth: Oct 24, 1995  MRN: [id number]    Date of Procedure: 10/16/2021    Pre-Op Diagnosis: Closed fracture of shaft of right clavicle [S42.021A]    Post-Op Diagnosis: Same       Procedure(s):  RIGHT ORIF  CLAVICLE FRACTURE    Surgeon(s):  Adam Gores, MD    Assistant:   Philipp Deputy PA template Williams Che PA, First assistance was needed as no other orthopaedist available. Assistance was needed in patient positioning retraction as well as closure. This improved patient care as well as efficiency and diminish the risk of complications.    Anesthesia: General    Estimated Blood Loss (mL): less than 50     Complications: None    Specimens:   * No specimens in log *    Implants:  Implant Name Type Inv. Item Serial No. Manufacturer Lot No. LRB No. Used Action   SCREW BNE L12MM DIA3.5MM CORT S STL ST NONCANNULATED LOK - [id number]  SCREW BNE L12MM DIA3.5MM CORT S STL ST NONCANNULATED LOK  DEPUY [employer]ES [employer]WD  Right 1 Implanted   SCREW BNE L16MM DIA3.5MM CORT S STL ST NONCANNULATED LOK - [id number]  SCREW BNE L16MM DIA3.5MM CORT S STL ST NONCANNULATED LOK  DEPUY [employer]ES [employer]-WD  Right 2 Implanted   PLATE BNE G31DV 9 H NONSTERILE ANT LAT CERV S STL LOK COMPR - [id number]  PLATE BNE G26RS 9 H NONSTERILE ANT LAT CERV S STL LOK COMPR  DEPUY SY[employer]ES USA-WD  Right 1 Implanted   SCREW BNE L22MM DIA3.5MM CORT S STL ST NONCANNULATED LOK - [id number]  SCREW BNE L22MM DIA3.5MM CORT S STL ST NONCANNULATED LOK  DEPUY SYNTHES USA-WD  Right 1 Implanted   SCREW CORTES 3.5MM SELF-TAPPING - [id number]  SCREW CORTES 3.5MM SELF-TAPPING  DEPUY SYNTHES USA-WD  Right 1 Implanted   SCREW BNE L30MM DIA2.7MM MTPHSEAL S STL ST FULL THRD T8 - [id number]  SCREW BNE L30MM DIA2.7MM MTPHSEAL S STL ST FULL THRD T8  DEPUY [employer] USA-WD  Right 1 Implanted   SCREW BNE L32MM DIA2.7MM MTPHSEAL S STL ST FULL THRD T8 - [id number]  SCREW BNE L32MM DIA2.7MM MTPHSEAL S STL ST FULL THRD T8  DEPUY  SY[employer]THES USA-WD  Right 1 Implanted   SCREW BNE L38MM DIA2.7MM MTPHSEAL S STL ST FULL THRD T8 - [id number]  SCREW BNE L38MM DIA2.7MM MTPHSEAL S STL ST FULL THRD T8  DEPUY [employer]THES [employer]-WD  Right 1 Implanted         Drains: * No LDAs found *        Detailed Description of Procedure:   Patient was brought the operating room on October 16, 2021.  Preoperatively a extremity block was placed for postoperative pain management.  After adequate general anesthesia was obtained patient was placed in the beachchair position and all bony prominences were well-padded.  Final timeout was performed.  The right shoulder was then prepped and draped in routine sterile fashion.  Bony anatomy was marked.  This time an curvilinear incision was made along the anterior border of the clavicle.  This was taken through the skin and dermis.  Subcutaneous tissues were bluntly dissected.  Bleeders were coagulated with the Bovie.  The fracture site was identified and subperiosteal dissection was performed initially along the proximal fragment exposing the fracture site and cleaning hematoma from within this.  Next the distal fragment was then dissected.  Once this was free thorough  irrigation was performed.  The fracture was then reduced and held temporarily with a lobster-claw clamp.  A superior to inferior lag screw was then placed using standard fashion.  Once this was completed an anterior plate was selected and bent to the appropriate contour.  This was then secured proximally using standard technique.  Additional screw was then placed distal to the fracture.  This was then checked with the [facility] to assure adequate alignment reduction and positioning.  Once this was completed the 2 screw holes were placed additionally proximally using standard technique as well as 3 screws distally placed.  Once this was completed this revealed anatomic alignment of the fracture.  Final x-rays were taken.  There irrigation was performed.  At this point the fascia  was repaired over the clavicle and fracture site with running 0 Vicryl suture.  Subcutaneous tissues were closed with 2-0 Monocryl and Monocryl.  This was then dressed with a Aquacel dressing.  The patient was then placed in a sling awoken from anesthesia and transferred to the recovery room in stable condition.  This is Jeanett Schlein, MD dictating.  Thank you end of dictation    Electronically signed by Adam Gores, MD on 10/16/2021 at 9:16 AM

## 2021-10-16 NOTE — Telephone Encounter (Signed)
Spoke to the patient in regards to the call from his mom about his post op meds, however the patient states he is good and understands his post op regime after reading the post op instructions. Patient has my direct number if he needs anything.

## 2021-10-16 NOTE — Anesthesia Pre-Procedure Evaluation (Signed)
Department of Anesthesiology  Preprocedure Note       Name:  Adam Pham   Age:  26 y.o.  DOB:  22-Oct-1995                                          MRN:  [id number]         Date:  10/16/2021      Surgeon: Moishe Spice):  Nelida Gores, MD    Procedure: Procedure(s):  RIGHT ORIF  CLAVICLE FRACTURE    Medications prior to admission:   Prior to Admission medications    Medication Sig Start Date End Date Taking? Authorizing Provider   cyclobenzaprine (FLEXERIL) 10 MG tablet Take 1 tablet by mouth 3 times daily as needed for Muscle spasms 10/12/21 10/22/21  Luvenia Heller, PA   HYDROcodone-acetaminophen (NORCO) 7.5-325 MG per tablet Take 1 tablet by mouth every 6 hours as needed for Pain for up to 5 days. Take lowest dose possible to manage pain Max Daily Amount: 4 tablets 10/12/21 10/17/21  Luvenia Heller, PA   celecoxib (CELEBREX) 200 MG capsule Take 1 capsule by mouth 2 times daily 1 capsule by mouth twice a day with food 10/12/21   Luvenia Heller, PA       Current medications:    Current Facility-Administered Medications   Medication Dose Route Frequency Provider Last Rate Last Admin   ??? ceFAZolin (ANCEF) 2000 mg in dextrose 3 % 50 mL IVPB (duplex)  2,000 mg IntraVENous On Call to OR Luvenia Heller, PA       ??? 0.9 % sodium chloride infusion   IntraVENous Continuous Luvenia Heller, PA       ??? lactated ringers IV soln infusion   IntraVENous Continuous Luvenia Heller, PA       ??? acetaminophen (TYLENOL) tablet 1,000 mg  1,000 mg Oral Once Luvenia Heller, PA       ??? celecoxib (CELEBREX) capsule 200 mg  200 mg Oral Once Luvenia Heller, PA           Allergies:    Allergies   Allergen Reactions   ??? Nuts [Peanut-Containing Drug Products] Anaphylaxis       Problem List:    Patient Active Problem List   Diagnosis Code   ??? Closed displaced fracture of shaft of right clavicle S42.021A       Past Medical History:        Diagnosis Date   ??? Injury of clavicle, right, initial encounter        Past Surgical History:         Procedure Laterality Date   ??? ANTERIOR CRUCIATE LIGAMENT REPAIR Left 2014    no complications       Social History:    Social History     Tobacco Use   ??? Smoking status: Never   ??? Smokeless tobacco: Never   Substance Use Topics   ??? Alcohol use: Yes     Alcohol/week: 2.0 standard drinks     Types: 2 Drinks containing 0.5 oz of alcohol per week                                Counseling given: Not Answered      Vital Signs (Current):   Vitals:    10/14/21  1258   Weight: 170 lb (77.1 kg)   Height: 6' (1.829 m)                                              BP Readings from Last 3 Encounters:   08/28/21 114/70       NPO Status:                                                                                 BMI:   Wt Readings from Last 3 Encounters:   10/14/21 170 lb (77.1 kg)   08/28/21 170 lb (77.1 kg)     Body mass index is 23.06 kg/m??.    CBC: No results found for: WBC, RBC, HGB, HCT, MCV, RDW, PLT    CMP: No results found for: NA, K, CL, CO2, BUN, CREATININE, GFRAA, AGRATIO, LABGLOM, GLUCOSE, GLU, PROT, CALCIUM, BILITOT, ALKPHOS, AST, ALT    POC Tests: No results for input(s): POCGLU, POCNA, POCK, POCCL, POCBUN, POCHEMO, POCHCT in the last 72 hours.    Coags: No results found for: PROTIME, INR, APTT    HCG (If Applicable): No results found for: PREGTESTUR, PREGSERUM, HCG, HCGQUANT     ABGs: No results found for: PHART, PO2ART, PCO2ART, HCO3ART, BEART, O2SATART     Type & Screen (If Applicable):  No results found for: LABABO, LABRH    Drug/Infectious Status (If Applicable):  No results found for: HIV, HEPCAB    COVID-19 Screening (If Applicable): No results found for: COVID19        Anesthesia Evaluation    Airway: Mallampati: II          Dental:          Pulmonary:                              Cardiovascular:                      Neuro/Psych:               GI/Hepatic/Renal:             Endo/Other:                     Abdominal:             Vascular:          Other Findings:           Anesthesia Plan      general      ASA 1       Induction: intravenous.      Anesthetic plan and risks discussed with patient.      Plan discussed with surgical team.                    Jasper Loser, MD   10/16/2021

## 2021-10-16 NOTE — Anesthesia Post-Procedure Evaluation (Signed)
Department of Anesthesiology  Postprocedure Note    Patient: Adam Pham  MRN: [id number]  Birthdate: December 20, 1995  Date of evaluation: 10/16/2021      Procedure Summary     Date: 10/16/21 Room / Location: [facility] OR 03 / [facility]    Anesthesia Start: 0806 Anesthesia Stop:     Procedure: RIGHT ORIF  CLAVICLE FRACTURE (Right: Clavicle) Diagnosis:       Closed fracture of shaft of right clavicle      (Closed fracture of shaft of right clavicle [Y63.785Y])    Surgeons: Nelida Gores, MD Responsible Provider: Jasper Loser, MD    Anesthesia Type: general ASA Status: 1          Anesthesia Type: No value filed.    Aldrete Phase I:      Aldrete Phase II:        Anesthesia Post Evaluation    Patient location during evaluation: PACU  Level of consciousness: lethargic  Nausea & Vomiting: no nausea  Complications: no  Cardiovascular status: hemodynamically stable  Respiratory status: acceptable

## 2021-10-16 NOTE — Telephone Encounter (Signed)
Mom has a few questions regarding patients medications.

## 2021-10-16 NOTE — Anesthesia Procedure Notes (Signed)
Peripheral Block    Patient location during procedure: pre-op  Reason for block: post-op pain management and at surgeon's request  Start time: 10/16/2021 7:49 AM  End time: 10/16/2021 7:59 AM  Staffing  Performed: anesthesiologist   Anesthesiologist: Roland Earl, MD  Preanesthetic Checklist  Completed: patient identified, IV checked, site marked, risks and benefits discussed, surgical/procedural consents, equipment checked, pre-op evaluation, timeout performed, anesthesia consent given, oxygen available and monitors applied/VS acknowledged  Peripheral Block   Patient position: supine  Prep: ChloraPrep  Provider prep: mask  Patient monitoring: continuous pulse ox, IV access, oxygen and responsive to questions  Block type: Brachial plexus  Interscalene  Laterality: right  Injection technique: single-shot  Guidance: ultrasound guided    Needle   Needle type: short-bevel   Needle gauge: 20 G  Needle localization: anatomical landmarks and ultrasound guidance  Needle length: 8 cm  Assessment   Injection assessment: negative aspiration for heme, no paresthesia on injection, local visualized surrounding nerve on ultrasound and no intravascular symptoms  Paresthesia pain: none  Slow fractionated injection: yes  Hemodynamics: stable  Real-time [place] image taken/store: yes  Outcomes: uncomplicated and patient tolerated procedure well    Additional Notes  The surgeon has consulted the department of regional anesthesia to place a peripheral nerve block for post-operative pain management for this patient. If a PNB catheter is placed, infusion is anticipated for up to 72 hours post-operatively unless otherwise discussed.     Prior to the above procedure, the alternatives, risks, side effects, and potential complications (including anesthetic toxicity, seizures, death, temporary and/or permanent nerve injury, and loss of extremity function) of the planned single injection and/or continuous peripheral nerve block. All were  thoroughly discussed, and the patient and/or guardian acknowledge and accept all risks of the planned procedure.    Block completed under direct U/S guidance. Frequent negative aspiration during injection. No pain on injection, low injection pressure, no paresthesia, no evidence of hematoma, and no other complications unless otherwise noted.    PERFORMED BLOCKS:  INTERSCALENE PERIPHERAL NERVE BLOCK  AND  OTHER NERVE BLOCK [64450]  Medications Administered  dexmedetomidine 200 MCG/2ML - Perineural   0.5 mL - 10/16/2021 7:49:00 AM  dexamethasone 4 MG/ML - Perineural   10 mg - 10/16/2021 7:49:00 AM  ropivacaine (NAROPIN) injection 0.5% - Perineural   20 mL - 10/16/2021 7:49:00 AM   5 mL - 10/16/2021 7:50:00 AM

## 2021-10-16 NOTE — Interval H&P Note (Signed)
Update History & Physical    The patient's History and Physical of October 14, 2021 was reviewed with the patient and I examined the patient. There was no change. The surgical site was confirmed by the patient and me.     Plan: The risks, benefits, expected outcome, and alternative to the recommended procedure have been discussed with the patient. Patient understands and wants to proceed with the procedure.     Electronically signed by Luvenia Heller, PA on 10/16/2021 at 8:13 AM

## 2021-10-16 NOTE — Discharge Instructions (Addendum)
1.  Dr. Lowery attaches a preprinted Discharge Packet with instructions and other information on the front of the patient's chart.  Please make sure that this is reviewed with and given to the patient and caregivers before the patient is discharged.     2.  Postoperative Prescriptions to take at home prescribed by  Dr. Lowery will be electronically prescribed and sent to the patient's pharmacy.     3.  Post -Op Appt:  Information regarding date, time and location of this appt can be found on Dr. Lowery's pre-printed DC instructions. If it can't be located on those instructions, please advise patient to call Dr. Lowery's office at [phone] to schedule an appt.       General Information:    -You may experience lightheadedness, forgetfulness, dizziness, sleepiness, headache, nausea, or sore throat following surgery.     -For any emergencies call 911    -Your reflexes will be diminished after receiving anesthetic drugs     Do not drive or operative heavy machinery for 24 hrs   Do not drink any alcohol or smoke for 24 hrs   Avoid making any important decision for 24 hrs   Do not stay alone for the next 24 hrs    Diet/Fluids    -Start off with a light first meal (no greasy or spicy food)    Activity    - You are advised to go home and restrict your activities for the rest of the day    Medications    -If you develop a fever (over 101*), chills, active bleeding, excessive swelling or nausea and vomiting past the 24hr period call your doctor.     Follow up care: Call the office if you don't already have an appointment scheduled.

## 2021-10-28 ENCOUNTER — Ambulatory Visit: Admit: 2021-10-28 | Discharge: 2021-10-28 | Payer: PRIVATE HEALTH INSURANCE | Attending: Surgical

## 2021-10-28 ENCOUNTER — Ambulatory Visit: Admit: 2021-10-28 | Discharge: 2021-10-28 | Payer: PRIVATE HEALTH INSURANCE

## 2021-10-28 DIAGNOSIS — S42021A Displaced fracture of shaft of right clavicle, initial encounter for closed fracture: Secondary | ICD-10-CM

## 2021-10-28 NOTE — Progress Notes (Signed)
ORTHOPAEDIC SURGERY CLINIC NOTE    Chief Complaint   Patient presents with    Post-Op Check     S/P ORIF RIGHT CLAVICLE FRACTURE DOS 10/16/21       History of Present Illness:  Patient is a 26 year old male returns status post right clavicle fracture ORIF by Dr. Janice Norrie.  Presents for his postop visit.  States he is doing very well and has less discomfort and overall symptomatology than he did preoperatively.  States overall he has been compliant with the sling.  He has been using the arm some but denies any strenuous activity, injury or trauma.  Overall pleased with the procedure and result thus far.  Denies complaints referable to shortness of breath or chest pain or DVT or infection.  Allergies   Allergen Reactions    Nuts [Peanut-Containing Drug Products] Anaphylaxis      Current Outpatient Medications   Medication Sig Dispense Refill    acetaminophen (TYLENOL) 500 MG tablet 2 tabs every 8 hours as needed for pain.  Do not exceed 3000mg  in a 24 hour period. 50 tablet 0    aspirin EC 81 MG EC tablet Take one tablet twice a day with food 60 tablet 0    gabapentin (NEURONTIN) 300 MG capsule Take 1 capsule by mouth nightly for 30 days. Intended supply: 30 days 30 capsule 0    ondansetron (ZOFRAN-ODT) 4 MG disintegrating tablet Take 1 tablet by mouth every 6 hours as needed for Nausea or Vomiting 30 tablet 0    celecoxib (CELEBREX) 200 MG capsule Take 1 capsule by mouth 2 times daily 1 capsule by mouth twice a day with food 60 capsule 0     No current facility-administered medications for this visit.     Vitals:    10/28/21 1110   Weight: 185 lb (83.9 kg)   Height: 6' (1.829 m)      Physical Examination:  General:  well-nourished, well-developed, alert and oriented.   Psych/Neuro:  Speech clear, appropriate mood and affect.  Chest:  normal inspiration and expiration. Symmetrical chest movement. No evidence of labored breathing.  Skin: See Musculoskeletal exam.  Musculoskeletal:    Examination of the right upper  extremity reveals the skin to be intact. There is no warmth or erythema or rashes or lesions. Good sensation and capillary refill 5 digits. Good range of motion of all 5 digits. 2+ radial pulse. Good muscle tone, skin turgor and temperature. Forearm and upper arm compartments soft and nontender. Triceps, biceps, wrist extension and flexion strength are intact.  No deformity at the shoulder.  Good alignment at the acromioclavicular and glenohumeral joints noted.  Is able to do modified Codman exercises well.  He has good range of motion of the wrist and elbow.  Imaging:    XR CLAVICLE RIGHT  I ordered and interpreted x-rays of the clavicle today.  They show good   alignment at the fracture site with hardware in good position consistent   with intraoperative imaging.         Assessment & Plan:   Diagnosis Orders   1. Closed displaced fracture of shaft of right clavicle, initial encounter  XR CLAVICLE RIGHT        Patient is doing well overall.  I reviewed today's x-rays with him which show good alignment at the fracture site and hardware in good position consistent with intraoperative imaging.  Made recommendations for rehab and recovery.  We will continue to wear the sling at all times set  for showering and changes close and performing modified Codman exercises which I instructed him on in detail.  Recommended he avoid any strenuous activity with the right upper extremity.  Follow-up in 2 weeks time for repeat evaluation and x-ray.  I outlined reasonable expectations for return to activity and exercise.  I made recommendations for appropriate activity and exercise and discussed any appropriate restrictions and modifications.  Follow-up sooner should any questions, concerns,  worsening symptoms or new symptoms arise. Patient  and caregivers expressed understanding and agreement with the plan. All questions answered.  Orders Placed This Encounter    XR CLAVICLE RIGHT     RM 2        Return in about 2 weeks (around  11/11/2021).     Luvenia Heller, PA  Physician Assistant  Orthopaedic Surgery    Electronically signed by Luvenia Heller, PA on 10/28/2021 at 1:49 PM

## 2021-11-11 ENCOUNTER — Ambulatory Visit: Admit: 2021-11-11 | Discharge: 2021-11-11 | Payer: PRIVATE HEALTH INSURANCE

## 2021-11-11 ENCOUNTER — Ambulatory Visit: Admit: 2021-11-11 | Discharge: 2021-11-11 | Payer: PRIVATE HEALTH INSURANCE | Attending: Surgical

## 2021-11-11 DIAGNOSIS — S42021D Displaced fracture of shaft of right clavicle, subsequent encounter for fracture with routine healing: Secondary | ICD-10-CM

## 2021-11-11 NOTE — Progress Notes (Signed)
ORTHOPAEDIC SURGERY CLINIC NOTE    Chief Complaint   Patient presents with    Follow-up     S/P ORIF RIGHT CLAVICLE FRACTURE DOS 10/16/21           History of Present Illness:  Patient is a 26 year old male presents today status post right clavicle ORIF by Dr. Janice Norrie.  He is doing well overall.  States he has discontinued the sling most of the time and has been working on shoulder range of motion exercises despite my recommendations to wear the sling but does seem to be doing well nonetheless.  He denies any injury or trauma.  He is pleased with the procedure and result at this point.  Discomfort is now minimal.  Denies complaints referable to shortness of breath or chest pain or DVT or infection.  Allergies   Allergen Reactions    Nuts [Peanut-Containing Drug Products] Anaphylaxis      Current Outpatient Medications   Medication Sig Dispense Refill    acetaminophen (TYLENOL) 500 MG tablet 2 tabs every 8 hours as needed for pain.  Do not exceed 3000mg  in a 24 hour period. 50 tablet 0    aspirin EC 81 MG EC tablet Take one tablet twice a day with food 60 tablet 0    gabapentin (NEURONTIN) 300 MG capsule Take 1 capsule by mouth nightly for 30 days. Intended supply: 30 days 30 capsule 0    ondansetron (ZOFRAN-ODT) 4 MG disintegrating tablet Take 1 tablet by mouth every 6 hours as needed for Nausea or Vomiting 30 tablet 0    celecoxib (CELEBREX) 200 MG capsule Take 1 capsule by mouth 2 times daily 1 capsule by mouth twice a day with food 60 capsule 0     No current facility-administered medications for this visit.     Vitals:    11/11/21 1055   Weight: 185 lb (83.9 kg)   Height: 6' (1.829 m)      Physical Examination:  General:  well-nourished, well-developed, alert and oriented.   Psych/Neuro:  Speech clear, appropriate mood and affect.  Chest:  normal inspiration and expiration. Symmetrical chest movement. No evidence of labored breathing.  Skin: See Musculoskeletal exam.  Musculoskeletal:    Examination of the  right upper extremity reveals well-healed surgical incisions without signs of infection such as warmth or erythema or induration or incisional drainage. Forearm and upper arm compartments soft and nontender.  Distal neurovascular function intact with good sensation and capillary refill 5 digits and good range of motion of all 5 digits and a 2+ radial pulse.  There is no deformity at the surgical site no warmth or erythema.  There is minimal swelling.  He does have intact and improved active range of motion of the right shoulder.  No warmth erythema noted.  Good range of motion of the wrist, elbow and fingers.  Imaging:    XR CLAVICLE RIGHT (CLINIC PERFORMED)  I ordered and interpreted x-rays of the clavicle today.  They show good   alignment at the fracture site consistent with prior imaging.  Surgical   plate and screws are in good position consistent with prior films.  No   evidence of displacement.         Assessment & Plan:   Diagnosis Orders   1. Closed displaced fracture of shaft of right clavicle with routine healing, subsequent encounter  XR CLAVICLE RIGHT (CLINIC PERFORMED)        Patient is doing well overall.  I explained to him  that I recommendation is to wear the sling for 6 weeks from the date of surgery particularly at this point when he is up and about and feeling better so as to avoid any strenuous activity or any trauma to the arm.  He expressed his understanding.  He will continue Codman and elbow range of motion and he can proceed with some gentle shoulder motion but nothing strenuous and no overhead activity.  Plan for follow-up in 2 weeks time for repeat evaluation and x-ray.  At that time we will likely allow him to advance some of his activity.  I made recommendations for appropriate activity and exercise and discussed any appropriate restrictions and modifications.  Follow-up sooner should any questions, concerns,  worsening symptoms or new symptoms arise. Patient  and caregivers expressed  understanding and agreement with the plan. All questions answered.  Orders Placed This Encounter    XR CLAVICLE RIGHT (CLINIC PERFORMED)     RM 1        Return in about 2 weeks (around 11/25/2021).     Luvenia Heller, PA  Physician Assistant  Orthopaedic Surgery    Electronically signed by Luvenia Heller, PA on 11/11/2021 at 2:01 PM

## 2021-11-25 ENCOUNTER — Ambulatory Visit: Admit: 2021-11-25 | Discharge: 2021-11-25 | Payer: PRIVATE HEALTH INSURANCE

## 2021-11-25 ENCOUNTER — Ambulatory Visit: Admit: 2021-11-25 | Discharge: 2021-11-25 | Payer: PRIVATE HEALTH INSURANCE | Attending: Surgical

## 2021-11-26 NOTE — Progress Notes (Signed)
ORTHOPAEDIC SURGERY CLINIC NOTE    Chief Complaint   Patient presents with    Follow-up     S/P ORIF RIGHT CLAVICLE FRACTURE DOS 10/16/21       History of Present Illness:  Patient is a 26 year old male who returns in follow-up status post right clavicle ORIF by Dr. Janice Norrie.  He is doing very well overall.  He has weaned out of the sling on his own and states he is doing well overall.  He notes significant improvement in range of motion and overall strength.  Denies any pain at the fracture site.  He has not noted any deformity.  Denies complaints referable to shortness of breath or chest pain or DVT or infection.  Allergies   Allergen Reactions    Nuts [Peanut-Containing Drug Products] Anaphylaxis      Current Outpatient Medications   Medication Sig Dispense Refill    acetaminophen (TYLENOL) 500 MG tablet 2 tabs every 8 hours as needed for pain.  Do not exceed 3000mg  in a 24 hour period. 50 tablet 0    aspirin EC 81 MG EC tablet Take one tablet twice a day with food 60 tablet 0    gabapentin (NEURONTIN) 300 MG capsule Take 1 capsule by mouth nightly for 30 days. Intended supply: 30 days 30 capsule 0    ondansetron (ZOFRAN-ODT) 4 MG disintegrating tablet Take 1 tablet by mouth every 6 hours as needed for Nausea or Vomiting 30 tablet 0    celecoxib (CELEBREX) 200 MG capsule Take 1 capsule by mouth 2 times daily 1 capsule by mouth twice a day with food 60 capsule 0     No current facility-administered medications for this visit.     Vitals:    11/25/21 1045   Weight: 185 lb (83.9 kg)   Height: 6' (1.829 m)      Physical Examination:  General:  well-nourished, well-developed, alert and oriented.   Psych/Neuro:  Speech clear, appropriate mood and affect.  Chest:  normal inspiration and expiration. Symmetrical chest movement. No evidence of labored breathing.  Skin: See Musculoskeletal exam.  Musculoskeletal:    Examination of the right upper extremity reveals well-healed surgical incisions without signs of infection  such as warmth or erythema or induration or incisional drainage. Forearm and upper arm compartments soft and nontender.  Distal neurovascular function intact with good sensation and capillary refill 5 digits and good range of motion of all 5 digits and a 2+ radial pulse.  No warmth erythema or swelling or deformity at the surgical site.  Good stability at the fracture site.  He has near full and symmetrical range of motion of the right shoulder when compared to the left.  Good rotator cuff strength is noted.  Full range of motion of the elbow and wrist are noted.  Imaging:    XR CLAVICLE RIGHT (CLINIC PERFORMED)  I ordered and interpreted x-rays of the right clavicle.  They show the   fracture to be in stable alignment consistent with prior films and   intraoperative imaging.  Hardware in good position without complicating   features.  Glenohumeral and acromioclavicular joints in good alignment.         Assessment & Plan:   Diagnosis Orders   1. Closed displaced fracture of shaft of right clavicle with routine healing, subsequent encounter  XR CLAVICLE RIGHT (CLINIC PERFORMED)        Patient is doing well overall.  He has progressed well at this point postoperatively.  X-rays  as above reviewed with the patient they show good alignment at the fracture site with signs of healing.  They also show the hardware to be in good position.  At this point will allow him to advance his activity.  I did caution him on strenuous activity at this point but did recommend he continue with progressive range of motion stretching and strengthening exercises.  He expressed his understanding.  Plan for follow-up in 4 weeks time for evaluation and range of motion check.  I outlined reasonable expectations for return to activity and exercise.  I made recommendations for appropriate activity and exercise and discussed any appropriate restrictions and modifications.  Follow-up sooner should any questions, concerns,  worsening symptoms or new  symptoms arise. Patient  and caregivers expressed understanding and agreement with the plan. All questions answered.  Orders Placed This Encounter    XR CLAVICLE RIGHT (CLINIC PERFORMED)     RM 3        Return in about 4 weeks (around 12/23/2021).     Luvenia Heller, PA  Physician Assistant  Orthopaedic Surgery    Electronically signed by Luvenia Heller, PA on 11/26/2021 at 8:59 AM

## 2021-12-03 ENCOUNTER — Encounter

## 2021-12-11 NOTE — Telephone Encounter (Signed)
Attempted to contact the patient however the phone went straight to VM. I was able to leave a detailed message with my direct number in regards to the patient's request for a refill of Celebrex and Gabapentin. I will attempt to call the patient again at the end of the day to see if this is just an automated request or if this was an actual request from the patient.

## 2021-12-22 NOTE — Telephone Encounter (Signed)
Spoke to the patient and he stated the refill request was automated from his pharmacy and does not need a refill at this time.

## 2021-12-23 ENCOUNTER — Encounter: Payer: PRIVATE HEALTH INSURANCE | Attending: Surgical

## 2021-12-28 NOTE — Telephone Encounter (Signed)
Pt is asking about physical therapy he could not make it to last apt because he has fx his knee

## 2022-01-05 NOTE — Telephone Encounter (Signed)
Contacted the patient to make sure he received his order. Patient stated he is currently being seen by physical therapy for a meniscal tear and they are also working with his clavicle injury. Patient will call me directly if he needs anything further.
